# Patient Record
Sex: Male | Born: 1975 | Race: White | Hispanic: No | Marital: Married | Smoking: Never smoker
Health system: Southern US, Community
[De-identification: ages and names within clinical notes are randomized; demographics above are authoritative.]

## PROBLEM LIST (undated history)

## (undated) DIAGNOSIS — I10 Essential (primary) hypertension: Secondary | ICD-10-CM

## (undated) DIAGNOSIS — R011 Cardiac murmur, unspecified: Secondary | ICD-10-CM

## (undated) DIAGNOSIS — F419 Anxiety disorder, unspecified: Secondary | ICD-10-CM

## (undated) DIAGNOSIS — K219 Gastro-esophageal reflux disease without esophagitis: Secondary | ICD-10-CM

## (undated) HISTORY — DX: Gastro-esophageal reflux disease without esophagitis: K21.9

## (undated) HISTORY — DX: Essential (primary) hypertension: I10

## (undated) HISTORY — PX: TONSILLECTOMY: SUR1361

---

## 2004-09-09 ENCOUNTER — Emergency Department: Payer: Self-pay | Admitting: Unknown Physician Specialty

## 2007-04-03 ENCOUNTER — Emergency Department: Payer: Self-pay | Admitting: Internal Medicine

## 2017-11-20 ENCOUNTER — Encounter: Payer: Self-pay | Admitting: Urology

## 2017-11-20 ENCOUNTER — Ambulatory Visit: Payer: BLUE CROSS/BLUE SHIELD | Admitting: Urology

## 2017-11-20 VITALS — BP 149/89 | HR 71 | Ht 69.5 in | Wt 180.7 lb

## 2017-11-20 DIAGNOSIS — N4340 Spermatocele of epididymis, unspecified: Secondary | ICD-10-CM | POA: Diagnosis not present

## 2017-11-21 ENCOUNTER — Encounter: Payer: Self-pay | Admitting: Urology

## 2017-11-21 DIAGNOSIS — N4340 Spermatocele of epididymis, unspecified: Secondary | ICD-10-CM | POA: Insufficient documentation

## 2017-11-21 NOTE — Progress Notes (Signed)
11/20/2017 7:19 AM   Douglas Medina [birth year] [id number]  Referring provider: No referring provider defined for this encounter.  Chief Complaint  Patient presents with  . Other    Scrotal mass    HPI: 42 year old male presents for evaluation of a left hemiscrotal mass.  He states he saw Dr. Evelene Croon approximately 3-4 years ago for a pea-sized left hemiscrotal mass and states he was told it was "fluid buildup".  He did not have a scrotal ultrasound.  He has noted gradual increased size of this mass over the last 2 years.  He has no pain or discomfort.  He wanted to have it further evaluated to see if any treatment was needed.   PMH: Past Medical History:  Diagnosis Date  . GERD (gastroesophageal reflux disease)   . Hypertension     Surgical History: History reviewed. No pertinent surgical history.  Home Medications:  Allergies as of 11/20/2017   No Known Allergies     Medication List        Accurate as of 11/20/17 11:59 PM. Always use your most recent med list.          lisinopril 20 MG tablet Commonly known as:  PRINIVIL,ZESTRIL TK 1 T PO QD   pantoprazole 20 MG tablet Commonly known as:  PROTONIX TK 1 T PO QD       Allergies: No Known Allergies  Family History: History reviewed. No pertinent family history.  Social History:  reports that he has never smoked. He has never used smokeless tobacco. He reports that he drank alcohol. He reports that he has current or past drug history.  ROS: UROLOGY Frequent Urination?: No Hard to postpone urination?: No Burning/pain with urination?: No Get up at night to urinate?: No Leakage of urine?: No Urine stream starts and stops?: No Trouble starting stream?: No Do you have to strain to urinate?: No Blood in urine?: No Urinary tract infection?: No Sexually transmitted disease?: No Injury to kidneys or bladder?: No Painful intercourse?: No Weak stream?: No Erection problems?: No Penile pain?:  No  Gastrointestinal Nausea?: No Vomiting?: No Indigestion/heartburn?: No Diarrhea?: No Constipation?: No  Constitutional Fever: No Night sweats?: No Weight loss?: No Fatigue?: No  Skin Skin rash/lesions?: No Itching?: No  Eyes Blurred vision?: No Double vision?: No  Ears/Nose/Throat Sore throat?: No Sinus problems?: No  Hematologic/Lymphatic Swollen glands?: No Easy bruising?: No  Cardiovascular Leg swelling?: No Chest pain?: No  Respiratory Cough?: No Shortness of breath?: No  Endocrine Excessive thirst?: No  Musculoskeletal Back pain?: No Joint pain?: No  Neurological Headaches?: No Dizziness?: No  Psychologic Depression?: No Anxiety?: No  Physical Exam: BP (!) 149/89 (BP Location: Left Arm, Patient Position: Sitting, Cuff Size: Normal)   Pulse 71   Ht 5' 9.5" (1.765 m)   Wt 180 lb 11.2 oz (82 kg)   BMI 26.30 kg/m   Constitutional:  Alert and oriented, No acute distress. HEENT: [place] AT, moist mucus membranes.  Trachea midline, no masses. Cardiovascular: No clubbing, cyanosis, or edema. Respiratory: Normal respiratory effort, no increased work of breathing. GI: Abdomen is soft, nontender, nondistended, no abdominal masses GU: No CVA tenderness.  Penis without lesions.  Testes descended bilaterally without masses or tenderness. ~3 cm mobile, transilluminating left supra testicular mass consistent with spermatocele. Lymph: No cervical or inguinal lymphadenopathy. Skin: No rashes, bruises or suspicious lesions. Neurologic: Grossly intact, no focal deficits, moving all 4 extremities. Psychiatric: Normal mood and affect.   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  Based on lack of symptoms I recommended observation.  Spermatocelectomy was also discussed.  He will monitor and if it continues to increase in size he indicated he would be interested in pursuing spermatocelectomy.  He will call back as  needed.   Riki Altes, MD  [facility] [address], [address] [place], [place] [address] [phone]

## 2018-01-02 ENCOUNTER — Other Ambulatory Visit: Payer: Self-pay | Admitting: Optometry

## 2018-01-03 ENCOUNTER — Other Ambulatory Visit: Payer: Self-pay | Admitting: Optometry

## 2018-01-03 DIAGNOSIS — H4921 Sixth [abducent] nerve palsy, right eye: Secondary | ICD-10-CM

## 2018-02-12 ENCOUNTER — Ambulatory Visit
Admission: RE | Admit: 2018-02-12 | Discharge: 2018-02-12 | Disposition: A | Discharge status: Home / Self Care | Payer: BLUE CROSS/BLUE SHIELD | Source: Ambulatory Visit | Attending: Optometry | Admitting: Optometry

## 2018-02-12 DIAGNOSIS — H4921 Sixth [abducent] nerve palsy, right eye: Secondary | ICD-10-CM | POA: Diagnosis present

## 2018-02-12 LAB — POCT I-STAT CREATININE: Creatinine, Ser: 0.9 mg/dL (ref 0.61–1.24)

## 2018-02-12 MED ORDER — GADOBUTROL 1 MMOL/ML IV SOLN
7.0000 mL | Freq: Once | INTRAVENOUS | Status: AC | PRN
  Administered 2018-02-12: 7 mL via INTRAVENOUS

## 2018-08-27 ENCOUNTER — Other Ambulatory Visit: Payer: Self-pay | Admitting: Student

## 2018-08-27 DIAGNOSIS — M25462 Effusion, left knee: Secondary | ICD-10-CM

## 2018-08-27 DIAGNOSIS — M25562 Pain in left knee: Secondary | ICD-10-CM

## 2018-09-10 ENCOUNTER — Ambulatory Visit
Admission: RE | Admit: 2018-09-10 | Discharge: 2018-09-10 | Disposition: A | Discharge status: Home / Self Care | Payer: BC Managed Care – PPO | Source: Ambulatory Visit | Attending: Student | Admitting: Student

## 2018-09-10 ENCOUNTER — Other Ambulatory Visit: Payer: Self-pay

## 2018-09-10 DIAGNOSIS — M25462 Effusion, left knee: Secondary | ICD-10-CM | POA: Diagnosis present

## 2018-09-10 DIAGNOSIS — M25562 Pain in left knee: Secondary | ICD-10-CM | POA: Insufficient documentation

## 2019-10-22 NOTE — Progress Notes (Signed)
° °  10/23/2019 11:34 AM   Douglas Medina 01/10/1976 [id number]  Referring provider: Jaclyn Shaggy, MD [address]   [place],  [place] [address] No chief complaint on file.   HPI: Douglas Medina is a 44 y.o. male who is seen today for follow-up of a left spermatocele  -Patient was last evaluated in 10/2017 for left spermatocele. -Since last visit he reported gradual increased size of this mass over the last 2 years.  -Notes mild discomfort with tighter clothing and some radiation into the left groin region -Is interested in spermatocele ectomy   PMH: Past Medical History:  Diagnosis Date   GERD (gastroesophageal reflux disease)    Hypertension     Surgical History: History reviewed. No pertinent surgical history.  Home Medications:  Allergies as of 10/23/2019   No Known Allergies     Medication List       Accurate as of October 23, 2019 11:34 AM. If you have any questions, ask your nurse or doctor.        lisinopril 20 MG tablet Commonly known as: ZESTRIL TK 1 T PO QD   pantoprazole 20 MG tablet Commonly known as: PROTONIX TK 1 T PO QD       Allergies: No Known Allergies  Family History: History reviewed. No pertinent family history.  Social History:  reports that he has never smoked. He has never used smokeless tobacco. He reports previous alcohol use. He reports previous drug use.   Physical Exam: BP (!) 177/88    Pulse 84    Ht 5\' 9"  (1.753 m)    Wt 176 lb (79.8 kg)    BMI 25.99 kg/m   Constitutional:  Alert and oriented, No acute distress. HEENT: [place] AT, moist mucus membranes.  Trachea midline, no masses. Cardiovascular: No clubbing, cyanosis, or edema. Respiratory: Normal respiratory effort, no increased work of breathing. GI: Abdomen is soft, nontender, nondistended, no abdominal masses GU: No CVA tenderness. 5 cm left spermatocele.  Testes descended bilaterally without masses or tenderness Lymph: No cervical or inguinal  lymphadenopathy. Skin: No rashes, bruises or suspicious lesions. Neurologic: Grossly intact, no focal deficits, moving all 4 extremities. Psychiatric: Normal mood and affect.   Assessment & Plan:    1. Left spermatocele   He would like to schedule spermatocelectomy  The procedure was discussed in detail including potential risks of bleeding, infection, scarring, recurrence, chronic scrotal pain as well as anesthetic risks.  The alternative of observation was discussed.  The postoperative care and follow-up was discussed   [facility] [address], [address] [place], [place] [phone]  I, [phone], am acting as a scribe for Dr. Theador Hawthorne C. Amelia Macken,  I have reviewed the above documentation for accuracy and completeness, and I agree with the above.   Lorin Picket, MD

## 2019-10-23 ENCOUNTER — Ambulatory Visit (INDEPENDENT_AMBULATORY_CARE_PROVIDER_SITE_OTHER): Payer: BC Managed Care – PPO | Admitting: Urology

## 2019-10-23 ENCOUNTER — Encounter: Payer: Self-pay | Admitting: Urology

## 2019-10-23 ENCOUNTER — Other Ambulatory Visit: Payer: Self-pay

## 2019-10-23 VITALS — BP 177/88 | HR 84 | Ht 69.0 in | Wt 176.0 lb

## 2019-10-23 DIAGNOSIS — N4340 Spermatocele of epididymis, unspecified: Secondary | ICD-10-CM | POA: Diagnosis not present

## 2019-10-26 ENCOUNTER — Other Ambulatory Visit: Payer: Self-pay | Admitting: Radiology

## 2019-10-26 DIAGNOSIS — N4340 Spermatocele of epididymis, unspecified: Secondary | ICD-10-CM

## 2019-12-08 ENCOUNTER — Ambulatory Visit: Payer: BC Managed Care – PPO | Admitting: Urology

## 2020-01-15 ENCOUNTER — Other Ambulatory Visit: Payer: BC Managed Care – PPO

## 2020-01-22 ENCOUNTER — Other Ambulatory Visit: Payer: BC Managed Care – PPO

## 2020-01-25 ENCOUNTER — Other Ambulatory Visit: Payer: BC Managed Care – PPO

## 2020-01-26 ENCOUNTER — Ambulatory Visit: Admit: 2020-01-26 | Payer: BC Managed Care – PPO | Admitting: Urology

## 2020-01-26 SURGERY — EXCISION, SPERMATOCELE
Anesthesia: Choice | Laterality: Left

## 2020-02-11 IMAGING — MR MRI OF THE LEFT KNEE WITHOUT CONTRAST
6 series · 40 of 40 positions shown · non-contrast
Comparison: None.

CLINICAL DATA: Medial left knee pain

EXAM:
MRI OF THE LEFT KNEE WITHOUT CONTRAST
TECHNIQUE: Multiplanar, multisequence MR imaging of the knee was performed. No
intravenous contrast was administered.

[Series 8: T2 fat-sat · axial · left · 4.0mm · 0.50mm/px · z∈[-88,+37]mm · 5 of 26 slices shown (1 of 3)]
[im 1/26]
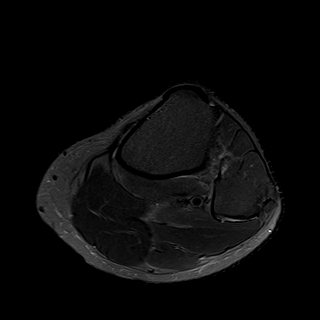
[im 7/26]
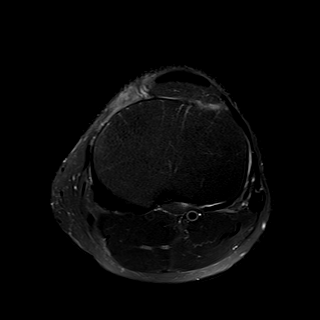
[im 13/26]
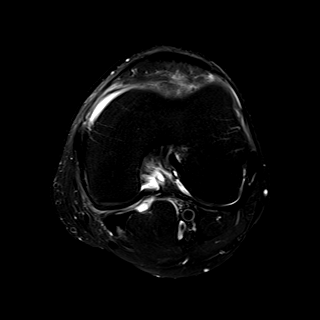
[im 19/26]
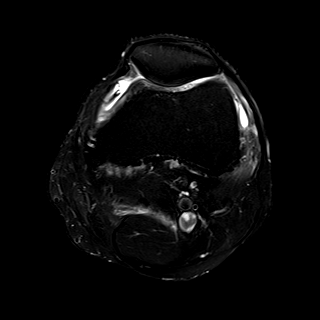
[im 26/26]
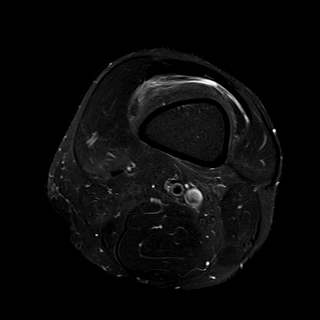

[Series 9: T1 · coronal · left · 4.0mm · 0.42mm/px · 7 of 28 slices shown]
[im 1/28]
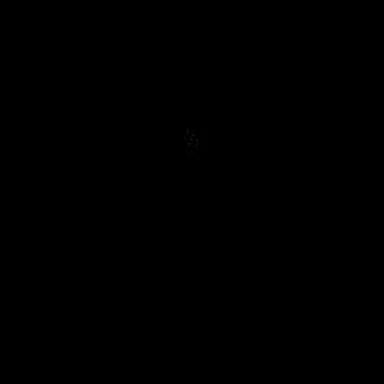
[im 5/28]
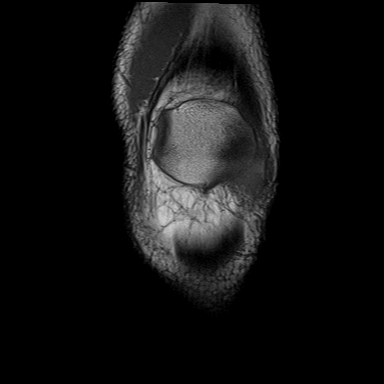
[im 10/28]
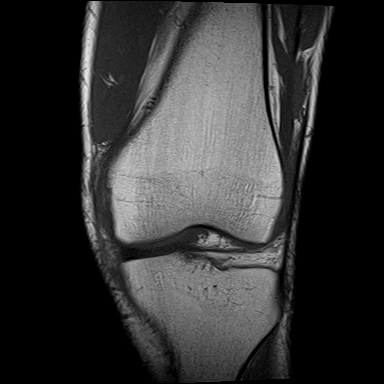
[im 14/28]
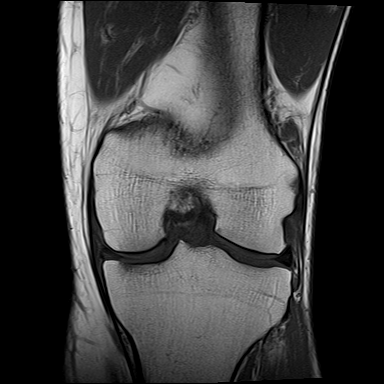
[im 19/28]
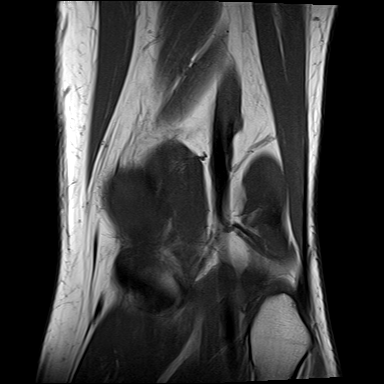
[im 23/28]
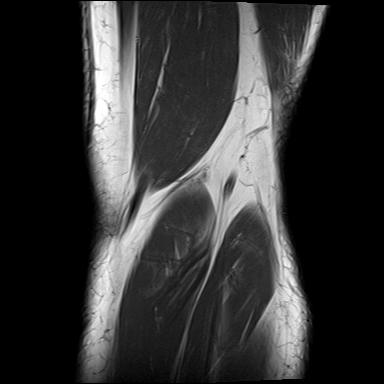
[im 28/28]
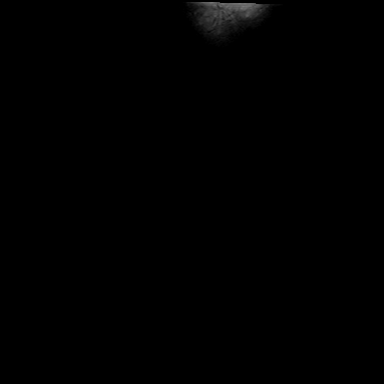

[Series 10: T2 fat-sat · coronal · left · 4.0mm · 0.59mm/px · 7 of 28 slices shown (2 of 3)]
[im 1/28]
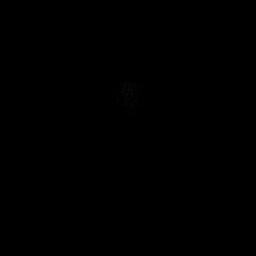
[im 5/28]
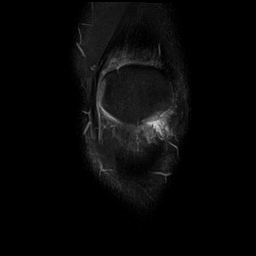
[im 10/28]
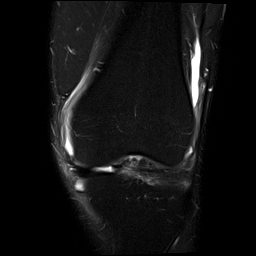
[im 14/28]
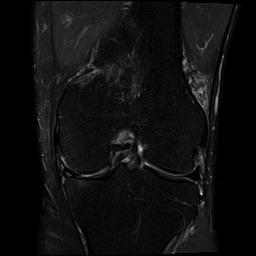
[im 19/28]
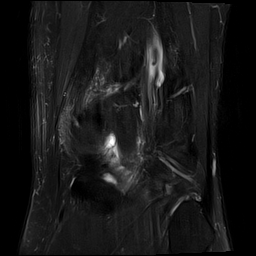
[im 23/28]
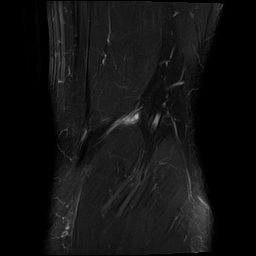
[im 28/28]
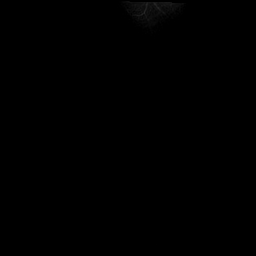

[Series 11: PD fat-sat · coronal · left · 4.0mm · 0.59mm/px · 7 of 28 slices shown (1 of 2)]
[im 1/28]
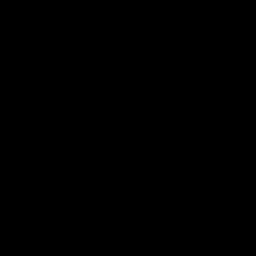
[im 5/28]
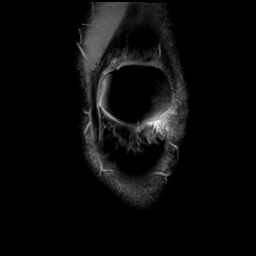
[im 10/28]
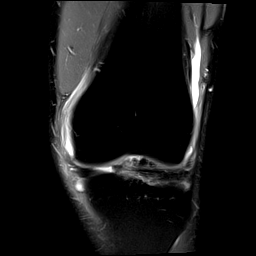
[im 14/28]
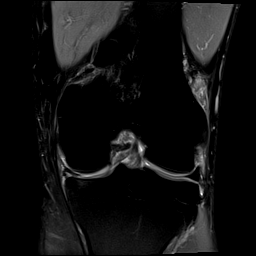
[im 19/28]
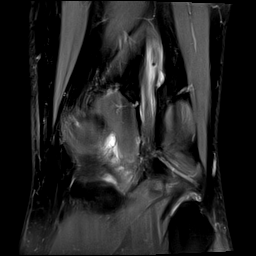
[im 23/28]
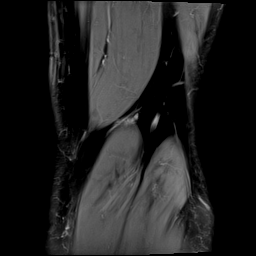
[im 28/28]
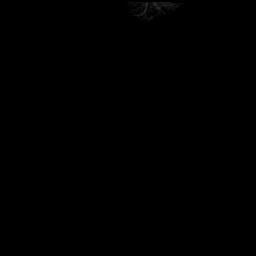

[Series 12: PD fat-sat · sagittal · left · 3.0mm · 0.59mm/px · 7 of 29 slices shown (2 of 2)]
[im 1/29]
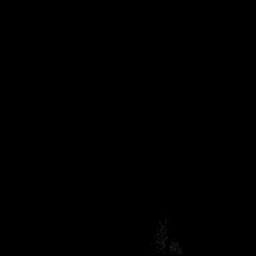
[im 5/29]
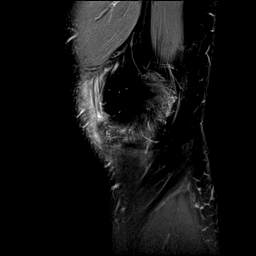
[im 10/29]
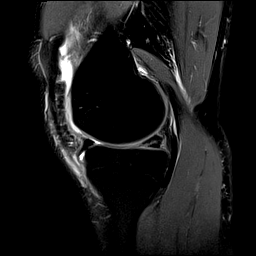
[im 15/29]
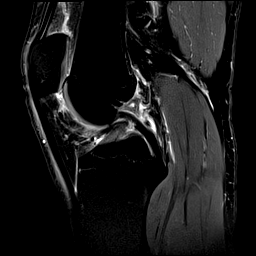
[im 19/29]
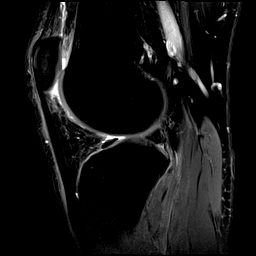
[im 24/29]
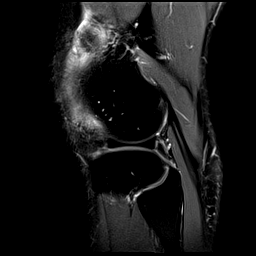
[im 29/29]
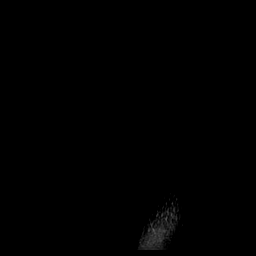

[Series 13: T2 fat-sat · sagittal · left · 3.0mm · 0.59mm/px · 7 of 31 slices shown (3 of 3)]
[im 1/31]
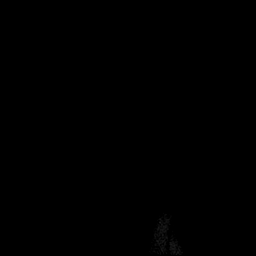
[im 6/31]
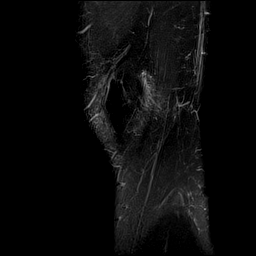
[im 11/31]
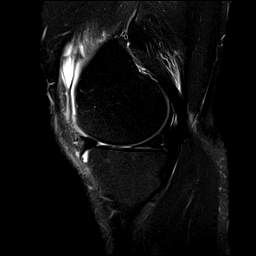
[im 16/31]
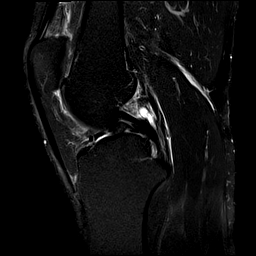
[im 21/31]
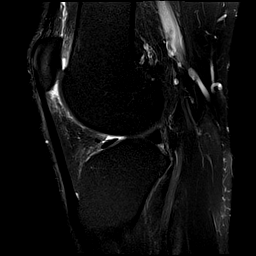
[im 26/31]
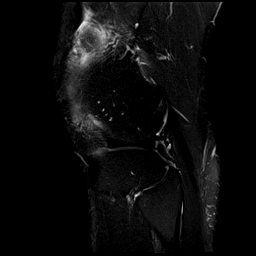
[im 31/31]
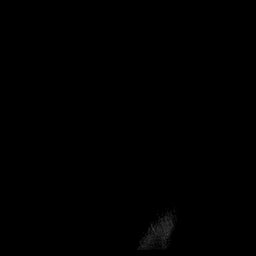

[40 of 40 positions shown; findings below may reference images not displayed]

FINDINGS: MENISCI

Medial meniscus: Extensive complex medial meniscal tear of the
medial meniscal body and posterior horn with prominent horizontal
component and a fragment of meniscal tissue displaced into the
intercondylar notch (series 12, image 15).

Lateral meniscus:  Intact.

LIGAMENTS

Cruciates:  Intact ACL and PCL.

Collaterals: Medial collateral ligament is intact. Lateral
collateral ligament complex is intact.

CARTILAGE

Patellofemoral: Mild surface irregularity of the superior patellar
apex. No focal trochlear cartilage defect identified.

Medial: Mild cartilage thinning of the weight-bearing aspect of the
medial compartment with faint subchondral marrow changes in the
medial tibial plateau.

Lateral:  No focal chondral defect.

Joint: Trace joint effusion. Mild edema within Hoffa's fat and the
suprapatellar fat.

Popliteal Fossa:  No Baker's cyst.  Intact popliteus tendon.

Extensor Mechanism:  Intact quadriceps tendon and patellar tendon.

Bones: Faint subchondral marrow edema within the medial compartment.
No fracture. No bone lesion.

Other: None.
IMPRESSION: 1. Complex tear of the medial meniscal body and posterior horn with
flap component displaced in the intercondylar notch.
2. Mild medial and patellofemoral compartment cartilage
irregularity.

## 2020-02-24 ENCOUNTER — Ambulatory Visit: Payer: BC Managed Care – PPO | Admitting: Urology

## 2020-03-09 ENCOUNTER — Ambulatory Visit: Payer: BC Managed Care – PPO | Admitting: Urology

## 2020-03-23 ENCOUNTER — Ambulatory Visit (INDEPENDENT_AMBULATORY_CARE_PROVIDER_SITE_OTHER): Payer: BC Managed Care – PPO | Admitting: Urology

## 2020-03-23 ENCOUNTER — Encounter: Payer: Self-pay | Admitting: Urology

## 2020-03-23 ENCOUNTER — Other Ambulatory Visit: Payer: Self-pay

## 2020-03-23 VITALS — BP 172/76 | HR 92 | Ht 70.0 in | Wt 180.0 lb

## 2020-03-23 DIAGNOSIS — N5201 Erectile dysfunction due to arterial insufficiency: Secondary | ICD-10-CM | POA: Diagnosis not present

## 2020-03-23 DIAGNOSIS — N4340 Spermatocele of epididymis, unspecified: Secondary | ICD-10-CM

## 2020-03-23 MED ORDER — TADALAFIL 20 MG PO TABS: 20.0000 mg | ORAL_TABLET | Freq: Every day | ORAL | 0 refills | Status: DC | PRN

## 2020-03-23 NOTE — Progress Notes (Signed)
° °  03/23/2020 12:30 PM   Douglas Medina Feb 24, 1975 [id number]  Referring provider: Jaclyn Shaggy, MD [address]   [place],  [place] [address]  Chief Complaint  Patient presents with   Other    HPI: 45 y.o. male presents for follow-up of a left spermatocele.   Was seen at 10/23/2019 and elected to schedule spermatocelectomy  He was scheduled in late December however elected to postpone secondary to COVID surge  No significant change in size or increased pain  He states his PCP recently doubled his dose of lisinopril for hypertension and now complains of difficulty achieving and maintaining an erection   PMH: Past Medical History:  Diagnosis Date   GERD (gastroesophageal reflux disease)    Hypertension     Surgical History: History reviewed. No pertinent surgical history.  Home Medications:  Allergies as of 03/23/2020   No Known Allergies     Medication List       Accurate as of March 23, 2020 11:59 PM. If you have any questions, ask your nurse or doctor.        lisinopril 20 MG tablet Commonly known as: ZESTRIL TK 1 T PO QD   pantoprazole 20 MG tablet Commonly known as: PROTONIX TK 1 T PO QD   tadalafil 20 MG tablet Commonly known as: CIALIS Take 1 tablet (20 mg total) by mouth daily as needed for erectile dysfunction. Take one tab one hour prior to intercourse Started by: Riki Altes, MD       Allergies: No Known Allergies  Family History: History reviewed. No pertinent family history.  Social History:  reports that he has never smoked. He has never used smokeless tobacco. He reports previous alcohol use. He reports previous drug use.   Physical Exam: BP (!) 172/76    Pulse 92    Ht 5\' 10"  (1.778 m)    Wt 180 lb (81.6 kg)    BMI 25.83 kg/m   Constitutional:  Alert and oriented, No acute distress. HEENT: [place] AT, moist mucus membranes.  Trachea midline, no masses. Cardiovascular: No clubbing, cyanosis, or edema. Respiratory:  Normal respiratory effort, no increased work of breathing. CV: RRR GI: Abdomen is soft, nontender, nondistended, no abdominal masses GU: Testes descended bilaterally without masses or tenderness.  5 cm left spermatocele Skin: No rashes, bruises or suspicious lesions. Neurologic: Grossly intact, no focal deficits, moving all 4 extremities. Psychiatric: Normal mood and affect.   Assessment & Plan:    1.  Left spermatocele  I again discussed the procedure in detail including potential risks of bleeding, infection, scarring, recurrence and chronic scrotal pain.  Alternatives were again reviewed including observation.  All questions were answered and he desires to proceed  2.  Erectile dysfunction  New problem  Onset noted after increasing dose of antihypertensive medication  He was interested in a PDE 5 inhibitor in Rx tadalafil 20 mg sent to pharmacy.  Potential side effects were discussed.   , MD  [facility] [address], [address] [place], [place] [phone]

## 2020-03-23 NOTE — H&P (View-Only) (Signed)
   03/23/2020 12:30 PM   [name] 1976-01-21 [id number]  Referring provider: Jaclyn Shaggy, MD [address]   [place],  [place] [address]  Chief Complaint  Patient presents with  . Other    HPI: 45 y.o. male presents for follow-up of a left spermatocele.   Was seen at 10/23/2019 and elected to schedule spermatocelectomy  He was scheduled in late December however elected to postpone secondary to COVID surge  No significant change in size or increased pain  He states his PCP recently doubled his dose of lisinopril for hypertension and now complains of difficulty achieving and maintaining an erection   PMH: Past Medical History:  Diagnosis Date  . GERD (gastroesophageal reflux disease)   . Hypertension     Surgical History: History reviewed. No pertinent surgical history.  Home Medications:  Allergies as of 03/23/2020   No Known Allergies     Medication List       Accurate as of March 23, 2020 11:59 PM. If you have any questions, ask your nurse or doctor.        lisinopril 20 MG tablet Commonly known as: ZESTRIL TK 1 T PO QD   pantoprazole 20 MG tablet Commonly known as: PROTONIX TK 1 T PO QD   tadalafil 20 MG tablet Commonly known as: CIALIS Take 1 tablet (20 mg total) by mouth daily as needed for erectile dysfunction. Take one tab one hour prior to intercourse Started by: Riki Altes, MD       Allergies: No Known Allergies  Family History: History reviewed. No pertinent family history.  Social History:  reports that he has never smoked. He has never used smokeless tobacco. He reports previous alcohol use. He reports previous drug use.   Physical Exam: BP (!) 172/76   Pulse 92   Ht 5\' 10"  (1.778 m)   Wt 180 lb (81.6 kg)   BMI 25.83 kg/m   Constitutional:  Alert and oriented, No acute distress. HEENT: [place] AT, moist mucus membranes.  Trachea midline, no masses. Cardiovascular: No clubbing, cyanosis, or edema. Respiratory:  Normal respiratory effort, no increased work of breathing. CV: RRR GI: Abdomen is soft, nontender, nondistended, no abdominal masses GU: Testes descended bilaterally without masses or tenderness.  5 cm left spermatocele Skin: No rashes, bruises or suspicious lesions. Neurologic: Grossly intact, no focal deficits, moving all 4 extremities. Psychiatric: Normal mood and affect.   Assessment & Plan:    1.  Left spermatocele  I again discussed the procedure in detail including potential risks of bleeding, infection, scarring, recurrence and chronic scrotal pain.  Alternatives were again reviewed including observation.  All questions were answered and he desires to proceed  2.  Erectile dysfunction  New problem  Onset noted after increasing dose of antihypertensive medication  He was interested in a PDE 5 inhibitor in Rx tadalafil 20 mg sent to pharmacy.  Potential side effects were discussed.   , MD  [facility] [address], [address] [place], [place] [phone]

## 2020-03-24 ENCOUNTER — Encounter: Payer: Self-pay | Admitting: Urology

## 2020-03-28 ENCOUNTER — Other Ambulatory Visit: Payer: Self-pay | Admitting: Radiology

## 2020-03-28 DIAGNOSIS — N4340 Spermatocele of epididymis, unspecified: Secondary | ICD-10-CM

## 2020-04-12 ENCOUNTER — Other Ambulatory Visit: Payer: Self-pay

## 2020-04-12 ENCOUNTER — Other Ambulatory Visit
Admission: RE | Admit: 2020-04-12 | Discharge: 2020-04-12 | Disposition: A | Discharge status: Home / Self Care | Payer: BC Managed Care – PPO | Source: Ambulatory Visit | Attending: Urology | Admitting: Urology

## 2020-04-12 HISTORY — DX: Cardiac murmur, unspecified: R01.1

## 2020-04-12 HISTORY — DX: Anxiety disorder, unspecified: F41.9

## 2020-04-12 NOTE — Patient Instructions (Addendum)
Your procedure is scheduled on: 04/19/20 - Tuesday Report to the Registration Desk on the 1st floor of the [facility]. To find out your arrival time, please call [phone] between 1PM - 3PM on: 04/18/20 - Monday Report to [facility] for Labs/ EKG and Covid testing 04/15/20 at 1pm  REMEMBER: Instructions that are not followed completely may result in serious medical risk, up to and including death; or upon the discretion of your surgeon and anesthesiologist your surgery may need to be rescheduled.  Do not eat food or drink any fluids after midnight the night before surgery.  No gum chewing, lozengers or hard candies.  TAKE THESE MEDICATIONS THE MORNING OF SURGERY WITH A SIP OF WATER:  - pantoprazole (PROTONIX) 20 MG tablet, take one the night before and one on the morning of surgery - helps to prevent nausea after surgery.  Follow recommendations from Cardiologist, Pulmonologist or PCP regarding stopping Aspirin, Coumadin, Plavix, Eliquis, Pradaxa, or Pletal.  One week prior to surgery: Stop Anti-inflammatories (NSAIDS) such as Advil, Aleve, Ibuprofen, Motrin, Naproxen, Naprosyn and Aspirin based products such as Excedrin, Goodys Powder, BC Powder.  Stop ANY OVER THE COUNTER supplements until after surgery.   No Alcohol for 24 hours before or after surgery.  No Smoking including e-cigarettes for 24 hours prior to surgery.  No chewable tobacco products for at least 6 hours prior to surgery.  No nicotine patches on the day of surgery.  Do not use any "recreational" drugs for at least a week prior to your surgery.  Please be advised that the combination of cocaine and anesthesia may have negative outcomes, up to and including death. If you test positive for cocaine, your surgery will be cancelled.  On the morning of surgery brush your teeth with toothpaste and water, you may rinse your mouth with mouthwash if you wish. Do not swallow any toothpaste or mouthwash.  Do not wear  jewelry, make-up, hairpins, clips or nail polish.  Do not wear lotions, powders, or perfumes.   Do not shave body from the neck down 48 hours prior to surgery just in case you cut yourself which could leave a site for infection.  Also, freshly shaved skin may become irritated if using the CHG soap.  Contact lenses, hearing aids and dentures may not be worn into surgery.  Do not bring valuables to the hospital. [facility] is not responsible for any missing/lost belongings or valuables.   Notify your doctor if there is any change in your medical condition (cold, fever, infection).  Wear comfortable clothing (specific to your surgery type) to the hospital.  Plan for stool softeners for home use; pain medications have a tendency to cause constipation. You can also help prevent constipation by eating foods high in fiber such as fruits and vegetables and drinking plenty of fluids as your diet allows.  After surgery, you can help prevent lung complications by doing breathing exercises.  Take deep breaths and cough every 1-2 hours. Your doctor may order a device called an Incentive Spirometer to help you take deep breaths. When coughing or sneezing, hold a pillow firmly against your incision with both hands. This is called "splinting." Doing this helps protect your incision. It also decreases belly discomfort.  If you are being admitted to the hospital overnight, leave your suitcase in the car. After surgery it may be brought to your room.  If you are being discharged the day of surgery, you will not be allowed to drive home. You  will need a responsible adult (18 years or older) to drive you home and stay with you that night.   If you are taking public transportation, you will need to have a responsible adult (18 years or older) with you. Please confirm with your physician that it is acceptable to use public transportation.   Please call the Pre-admissions Testing Dept. at [phone] if  you have any questions about these instructions.  Surgery Visitation Policy:  Patients undergoing a surgery or procedure may have one family member or support person with them as long as that person is not COVID-19 positive or experiencing its symptoms.  That person may remain in the waiting area during the procedure.  Inpatient Visitation:    Visiting hours are 7 a.m. to 8 p.m. Inpatients will be allowed two visitors daily. The visitors may change each day during the patient's stay. No visitors under the age of 62. Any visitor under the age of 80 must be accompanied by an adult. The visitor must pass COVID-19 screenings, use hand sanitizer when entering and exiting the patient's room and wear a mask at all times, including in the patient's room. Patients must also wear a mask when staff or their visitor are in the room. Masking is required regardless of vaccination status.

## 2020-04-15 ENCOUNTER — Other Ambulatory Visit: Payer: Self-pay

## 2020-04-15 ENCOUNTER — Other Ambulatory Visit
Admission: RE | Admit: 2020-04-15 | Discharge: 2020-04-15 | Disposition: A | Discharge status: Home / Self Care | Payer: BC Managed Care – PPO | Source: Ambulatory Visit | Attending: Urology | Admitting: Urology

## 2020-04-15 DIAGNOSIS — Z01818 Encounter for other preprocedural examination: Secondary | ICD-10-CM | POA: Diagnosis not present

## 2020-04-15 DIAGNOSIS — Z20822 Contact with and (suspected) exposure to covid-19: Secondary | ICD-10-CM | POA: Insufficient documentation

## 2020-04-15 DIAGNOSIS — I10 Essential (primary) hypertension: Secondary | ICD-10-CM | POA: Insufficient documentation

## 2020-04-15 LAB — BASIC METABOLIC PANEL
Anion gap: 7 (ref 5–15)
BUN: 17 mg/dL (ref 6–20)
CO2: 26 mmol/L (ref 22–32)
Calcium: 9.3 mg/dL (ref 8.9–10.3)
Chloride: 107 mmol/L (ref 98–111)
Creatinine, Ser: 0.82 mg/dL (ref 0.61–1.24)
GFR, Estimated: >60 mL/min (ref >60)
Glucose, Bld: 84 mg/dL (ref 70–99)
Potassium: 3.8 mmol/L (ref 3.5–5.1)
Sodium: 140 mmol/L (ref 135–145)

## 2020-04-15 LAB — CBC
HCT: 44.5 % (ref 39.0–52.0)
Hemoglobin: 15.9 g/dL (ref 13.0–17.0)
MCH: 30.8 pg (ref 26.0–34.0)
MCHC: 35.7 g/dL (ref 30.0–36.0)
MCV: 86.1 fL (ref 80.0–100.0)
Platelets: 261 10*3/uL (ref 150–400)
RBC: 5.17 MIL/uL (ref 4.22–5.81)
RDW: 11.9 % (ref 11.5–15.5)
WBC: 6.4 10*3/uL (ref 4.0–10.5)
nRBC: 0 % (ref 0.0–0.2)

## 2020-04-15 LAB — SARS CORONAVIRUS 2 (TAT 6-24 HRS): SARS Coronavirus 2: NEGATIVE

## 2020-04-19 ENCOUNTER — Other Ambulatory Visit: Payer: Self-pay

## 2020-04-19 ENCOUNTER — Ambulatory Visit: Payer: BC Managed Care – PPO | Admitting: Anesthesiology

## 2020-04-19 ENCOUNTER — Encounter: Payer: Self-pay | Admitting: Urology

## 2020-04-19 ENCOUNTER — Ambulatory Visit
Admission: RE | Admit: 2020-04-19 | Discharge: 2020-04-19 | Disposition: A | Discharge status: Home / Self Care | Payer: BC Managed Care – PPO | Attending: Urology | Admitting: Urology

## 2020-04-19 ENCOUNTER — Encounter
Admission: RE | Disposition: A | Discharge status: Home / Self Care | Payer: Self-pay | Source: Home / Self Care | Attending: Urology

## 2020-04-19 DIAGNOSIS — Z79899 Other long term (current) drug therapy: Secondary | ICD-10-CM | POA: Diagnosis not present

## 2020-04-19 DIAGNOSIS — N4341 Spermatocele of epididymis, single: Secondary | ICD-10-CM | POA: Insufficient documentation

## 2020-04-19 DIAGNOSIS — N4340 Spermatocele of epididymis, unspecified: Secondary | ICD-10-CM | POA: Diagnosis not present

## 2020-04-19 HISTORY — PX: SPERMATOCELECTOMY: SHX2420

## 2020-04-19 SURGERY — EXCISION, SPERMATOCELE
Anesthesia: General | Laterality: Left

## 2020-04-19 MED ORDER — FENTANYL CITRATE (PF) 100 MCG/2ML IJ SOLN
INTRAMUSCULAR | Status: DC | PRN
  Administered 2020-04-19 (×2): 50 ug via INTRAVENOUS

## 2020-04-19 MED ORDER — EPHEDRINE 5 MG/ML INJ
INTRAVENOUS | Status: AC
  Filled 2020-04-19: qty 10

## 2020-04-19 MED ORDER — PROPOFOL 10 MG/ML IV BOLUS
INTRAVENOUS | Status: DC | PRN
  Administered 2020-04-19: 200 mg via INTRAVENOUS

## 2020-04-19 MED ORDER — DOUBLE ANTIBIOTIC 500-10000 UNIT/GM EX OINT
TOPICAL_OINTMENT | CUTANEOUS | Status: DC | PRN
  Administered 2020-04-19: 1 via TOPICAL

## 2020-04-19 MED ORDER — CHLORHEXIDINE GLUCONATE 0.12 % MT SOLN
15.0000 mL | Freq: Once | OROMUCOSAL | Status: AC
  Administered 2020-04-19: 15 mL via OROMUCOSAL

## 2020-04-19 MED ORDER — PHENYLEPHRINE HCL (PRESSORS) 10 MG/ML IV SOLN
INTRAVENOUS | Status: AC
  Filled 2020-04-19: qty 1

## 2020-04-19 MED ORDER — CEFAZOLIN SODIUM-DEXTROSE 2-4 GM/100ML-% IV SOLN
2.0000 g | INTRAVENOUS | Status: AC
  Administered 2020-04-19: 2 g via INTRAVENOUS

## 2020-04-19 MED ORDER — ONDANSETRON HCL 4 MG/2ML IJ SOLN
INTRAMUSCULAR | Status: AC
  Filled 2020-04-19: qty 2

## 2020-04-19 MED ORDER — DEXMEDETOMIDINE (PRECEDEX) IN NS 20 MCG/5ML (4 MCG/ML) IV SYRINGE
PREFILLED_SYRINGE | INTRAVENOUS | Status: DC | PRN
  Administered 2020-04-19 (×2): 8 ug via INTRAVENOUS
  Administered 2020-04-19: 4 ug via INTRAVENOUS

## 2020-04-19 MED ORDER — ACETAMINOPHEN 10 MG/ML IV SOLN
INTRAVENOUS | Status: AC
  Filled 2020-04-19: qty 100

## 2020-04-19 MED ORDER — PROPOFOL 10 MG/ML IV BOLUS
INTRAVENOUS | Status: AC
  Filled 2020-04-19: qty 20

## 2020-04-19 MED ORDER — ORAL CARE MOUTH RINSE: 15.0000 mL | Freq: Once | OROMUCOSAL | Status: AC

## 2020-04-19 MED ORDER — LIDOCAINE HCL (CARDIAC) PF 100 MG/5ML IV SOSY
PREFILLED_SYRINGE | INTRAVENOUS | Status: DC | PRN
  Administered 2020-04-19: 80 mg via INTRAVENOUS

## 2020-04-19 MED ORDER — ONDANSETRON HCL 4 MG/2ML IJ SOLN: 4.0000 mg | Freq: Once | INTRAMUSCULAR | Status: DC | PRN

## 2020-04-19 MED ORDER — ONDANSETRON HCL 4 MG/2ML IJ SOLN
INTRAMUSCULAR | Status: DC | PRN
  Administered 2020-04-19: 4 mg via INTRAVENOUS

## 2020-04-19 MED ORDER — DEXAMETHASONE SODIUM PHOSPHATE 10 MG/ML IJ SOLN
INTRAMUSCULAR | Status: AC
  Filled 2020-04-19: qty 1

## 2020-04-19 MED ORDER — LIDOCAINE HCL (PF) 2 % IJ SOLN
INTRAMUSCULAR | Status: AC
  Filled 2020-04-19: qty 5

## 2020-04-19 MED ORDER — FENTANYL CITRATE (PF) 100 MCG/2ML IJ SOLN
25.0000 ug | INTRAMUSCULAR | Status: DC | PRN
  Administered 2020-04-19 (×4): 25 ug via INTRAVENOUS

## 2020-04-19 MED ORDER — MIDAZOLAM HCL 2 MG/2ML IJ SOLN
INTRAMUSCULAR | Status: AC
  Filled 2020-04-19: qty 2

## 2020-04-19 MED ORDER — HYDROCODONE-ACETAMINOPHEN 5-325 MG PO TABS: 1.0000 | ORAL_TABLET | Freq: Four times a day (QID) | ORAL | 0 refills | Status: DC | PRN

## 2020-04-19 MED ORDER — CHLORHEXIDINE GLUCONATE 0.12 % MT SOLN
OROMUCOSAL | Status: AC
  Filled 2020-04-19: qty 15

## 2020-04-19 MED ORDER — FENTANYL CITRATE (PF) 100 MCG/2ML IJ SOLN
INTRAMUSCULAR | Status: AC
  Filled 2020-04-19: qty 2

## 2020-04-19 MED ORDER — BACITRACIN ZINC 500 UNIT/GM EX OINT
TOPICAL_OINTMENT | CUTANEOUS | Status: AC
  Filled 2020-04-19: qty 28.35

## 2020-04-19 MED ORDER — EPHEDRINE SULFATE 50 MG/ML IJ SOLN
INTRAMUSCULAR | Status: DC | PRN
  Administered 2020-04-19: 5 mg via INTRAVENOUS
  Administered 2020-04-19: 10 mg via INTRAVENOUS
  Administered 2020-04-19: 5 mg via INTRAVENOUS

## 2020-04-19 MED ORDER — BUPIVACAINE HCL 0.25 % IJ SOLN
INTRAMUSCULAR | Status: DC | PRN
  Administered 2020-04-19: 4 mL

## 2020-04-19 MED ORDER — LACTATED RINGERS IV SOLN: INTRAVENOUS | Status: DC

## 2020-04-19 MED ORDER — DEXAMETHASONE SODIUM PHOSPHATE 10 MG/ML IJ SOLN
INTRAMUSCULAR | Status: DC | PRN
  Administered 2020-04-19: 10 mg via INTRAVENOUS

## 2020-04-19 MED ORDER — CEFAZOLIN SODIUM-DEXTROSE 2-4 GM/100ML-% IV SOLN
INTRAVENOUS | Status: AC
  Filled 2020-04-19: qty 100

## 2020-04-19 MED ORDER — ACETAMINOPHEN 10 MG/ML IV SOLN
INTRAVENOUS | Status: DC | PRN
  Administered 2020-04-19: 1000 mg via INTRAVENOUS

## 2020-04-19 MED ORDER — BUPIVACAINE HCL (PF) 0.25 % IJ SOLN
INTRAMUSCULAR | Status: AC
  Filled 2020-04-19: qty 30

## 2020-04-19 MED ORDER — DEXMEDETOMIDINE (PRECEDEX) IN NS 20 MCG/5ML (4 MCG/ML) IV SYRINGE
PREFILLED_SYRINGE | INTRAVENOUS | Status: AC
  Filled 2020-04-19: qty 5

## 2020-04-19 SURGICAL SUPPLY — 32 items
APL PRP STRL LF DISP 70% ISPRP (MISCELLANEOUS) ×1
BLADE SURG 15 STRL LF DISP TIS (BLADE) ×1 IMPLANT
BLADE SURG 15 STRL SS (BLADE) ×2
CANISTER SUCT 1200ML W/VALVE (MISCELLANEOUS) IMPLANT
CHLORAPREP W/TINT 26 (MISCELLANEOUS) ×2 IMPLANT
COVER WAND RF STERILE (DRAPES) ×2 IMPLANT
DRAIN PENROSE 0.625X18 (DRAIN) ×2 IMPLANT
DRAPE LAPAROTOMY 77X122 PED (DRAPES) ×2 IMPLANT
ELECT CAUTERY NEEDLE TIP 1.0 (MISCELLANEOUS) ×2
ELECT REM PT RETURN 9FT ADLT (ELECTROSURGICAL) ×2
ELECTRODE CAUTERY NEDL TIP 1.0 (MISCELLANEOUS) ×1 IMPLANT
ELECTRODE REM PT RTRN 9FT ADLT (ELECTROSURGICAL) ×1 IMPLANT
GAUZE SPONGE 4X4 12PLY STRL (GAUZE/BANDAGES/DRESSINGS) ×2 IMPLANT
GLOVE SURG UNDER POLY LF SZ7.5 (GLOVE) ×2 IMPLANT
GOWN STRL REUS W/ TWL LRG LVL3 (GOWN DISPOSABLE) ×2 IMPLANT
GOWN STRL REUS W/TWL LRG LVL3 (GOWN DISPOSABLE) ×4
GOWN STRL REUS W/TWL XL LVL3 (GOWN DISPOSABLE) ×2 IMPLANT
KIT TURNOVER KIT A (KITS) ×2 IMPLANT
LABEL OR SOLS (LABEL) ×2 IMPLANT
MANIFOLD NEPTUNE II (INSTRUMENTS) ×2 IMPLANT
NEEDLE HYPO 25X1 1.5 SAFETY (NEEDLE) ×2 IMPLANT
NS IRRIG 1000ML POUR BTL (IV SOLUTION) ×2 IMPLANT
PACK BASIN MINOR ARMC (MISCELLANEOUS) ×2 IMPLANT
SUPPORETR ATHLETIC LG (MISCELLANEOUS) ×1 IMPLANT
SUPPORTER ATHLETIC LG (MISCELLANEOUS) ×2
SUT CHROMIC 3 0 SH 27 (SUTURE) ×2 IMPLANT
SUT MNCRL 3-0 UNDYED SH (SUTURE) ×1 IMPLANT
SUT MONOCRYL 3-0 UNDYED (SUTURE) ×1
SUT SILK 0 CT 1 30 (SUTURE) ×2 IMPLANT
SUT VIC AB 3-0 SH 27 (SUTURE) ×4
SUT VIC AB 3-0 SH 27X BRD (SUTURE) ×2 IMPLANT
SYR 10ML LL (SYRINGE) ×2 IMPLANT

## 2020-04-19 NOTE — Interval H&P Note (Signed)
History and Physical Interval Note:  04/19/2020 7:19 AM  Douglas Medina  has presented today for surgery, with the diagnosis of left spermatocele.  The various methods of treatment have been discussed with the patient and family. After consideration of risks, benefits and other options for treatment, the patient has consented to  Procedure(s): SPERMATOCELECTOMY (Left) as a surgical intervention.  The patient's history has been reviewed, patient examined, no change in status, stable for surgery.  I have reviewed the patient's chart and labs.  Questions were answered to the patient's satisfaction.     Alida Greiner C Celedonio Sortino

## 2020-04-19 NOTE — Transfer of Care (Signed)
Immediate Anesthesia Transfer of Care Note  Patient: Douglas Medina  Procedure(s) Performed: SPERMATOCELECTOMY (Left )  Patient Location: PACU  Anesthesia Type:General  Level of Consciousness: drowsy  Airway & Oxygen Therapy: Patient Spontanous Breathing and Patient connected to face mask oxygen  Post-op Assessment: Report given to RN and Post -op Vital signs reviewed and stable  Post vital signs: Reviewed and stable  Last Vitals:  Vitals Value Taken Time  BP 140/80 04/19/20 0845  Temp    Pulse 47 04/19/20 0847  Resp 10 04/19/20 0847  SpO2 97 % 04/19/20 0847  Vitals shown include unvalidated device data.  Last Pain:  Vitals:   04/19/20 0615  PainSc: 0-No pain         Complications: No complications documented.

## 2020-04-19 NOTE — Anesthesia Postprocedure Evaluation (Signed)
Anesthesia Post Note  Patient: Douglas Medina  Procedure(s) Performed: SPERMATOCELECTOMY (Left )  Patient location during evaluation: PACU Anesthesia Type: General Level of consciousness: awake and alert and oriented Pain management: pain level controlled Vital Signs Assessment: post-procedure vital signs reviewed and stable Respiratory status: spontaneous breathing Cardiovascular status: blood pressure returned to baseline Anesthetic complications: no   No complications documented.   Last Vitals:  Vitals:   04/19/20 0901 04/19/20 0915  BP: 118/64 (!) 144/86  Pulse: (!) 48 90  Resp: (!) 21   Temp:    SpO2: 99%     Last Pain:  Vitals:   04/19/20 0917  PainSc: 8                  Nakiyah Beverley

## 2020-04-19 NOTE — Anesthesia Preprocedure Evaluation (Signed)
Anesthesia Evaluation  Patient identified by MRN, date of birth, ID band Patient awake    Reviewed: Allergy & Precautions, NPO status , Patient's Chart, lab work & pertinent test results  Airway Mallampati: II  TM Distance: >3 FB     Dental  (+) Teeth Intact   Pulmonary neg pulmonary ROS,    Pulmonary exam normal        Cardiovascular hypertension, Normal cardiovascular exam+ Valvular Problems/Murmurs      Neuro/Psych Anxiety    GI/Hepatic Neg liver ROS, GERD  Medicated and Controlled,  Endo/Other  negative endocrine ROS  Renal/GU negative Renal ROS  negative genitourinary   Musculoskeletal negative musculoskeletal ROS (+)   Abdominal Normal abdominal exam  (+)   Peds negative pediatric ROS (+)  Hematology negative hematology ROS (+)   Anesthesia Other Findings   Reproductive/Obstetrics                             Anesthesia Physical Anesthesia Plan  ASA: II  Anesthesia Plan: General   Post-op Pain Management:    Induction: Intravenous  PONV Risk Score and Plan:   Airway Management Planned: LMA and Oral ETT  Additional Equipment:   Intra-op Plan:   Post-operative Plan: Extubation in OR  Informed Consent: I have reviewed the patients History and Physical, chart, labs and discussed the procedure including the risks, benefits and alternatives for the proposed anesthesia with the patient or authorized representative who has indicated his/her understanding and acceptance.     Dental advisory given  Plan Discussed with: CRNA and Surgeon  Anesthesia Plan Comments:         Anesthesia Quick Evaluation

## 2020-04-19 NOTE — Anesthesia Procedure Notes (Signed)
Procedure Name: LMA Insertion Date/Time: 04/19/2020 7:36 AM Performed by: Elmarie Mainland, CRNA Pre-anesthesia Checklist: Patient identified, Emergency Drugs available, Suction available and Patient being monitored Patient Re-evaluated:Patient Re-evaluated prior to induction Oxygen Delivery Method: Circle system utilized Induction Type: IV induction Ventilation: Mask ventilation without difficulty LMA: LMA inserted LMA Size: 4.5 Number of attempts: 1 Placement Confirmation: positive ETCO2 and breath sounds checked- equal and bilateral Tube secured with: Tape Dental Injury: Teeth and Oropharynx as per pre-operative assessment

## 2020-04-19 NOTE — Discharge Instructions (Signed)
Discharge instructions following scrotal surgery  Call your doctor for:  Fever is greater than 100.5  Severe nausea or vomiting  Increasing pain not controlled by pain medication  Increasing redness or drainage from incisions  The number for questions or concerns is [phone]  Activity level: No lifting greater than 10 pounds (about equal to gallon of milk) for the next 2 weeks or until cleared to do so at follow-up appointment.  Otherwise activity as tolerated by comfort level.  Diet: May resume your regular diet as tolerated  Medications:  You may resume your regular medications  A prescription for pain medication was sent to your pharmacy  Driving: No driving while still taking opiate pain medications (weight at least 6-8 hours after last dose).  No driving if you still sore from surgery as it may limit her ability to react quickly if necessary.   Shower/bath: May shower 48 hours after surgery.  No tub bath, hot tub or swimming for 10 days.  Wound care: He may cover wounds with sterile gauze as needed to prevent incisions rubbing on close follow-up in any seepage.  Where supportive underwear (compression shorts, jockey shorts) for at least 2 weeks.  He should apply cold compresses (ice or sac of frozen peas/corn) to your scrotum for at least 48 hours to reduce the swelling.  You should expect that his scrotum will swell up initially and then get smaller over the next 2-4 weeks.  Bruising and swelling is normal  Follow-up appointments: Follow-up appointment scheduled with Dr. Lonna Cobb 05/18/2020  AMBULATORY SURGERY  DISCHARGE INSTRUCTIONS   1) The drugs that you were given will stay in your system until tomorrow so for the next 24 hours you should not:  A) Drive an automobile B) Make any legal decisions C) Drink any alcoholic beverage   2) You may resume regular meals tomorrow.  Today it is better to start with liquids and gradually work up to solid foods.  You  may eat anything you prefer, but it is better to start with liquids, then soup and crackers, and gradually work up to solid foods.   3) Please notify your doctor immediately if you have any unusual bleeding, trouble breathing, redness and pain at the surgery site, drainage, fever, or pain not relieved by medication.    4) Additional Instructions:   Please contact your physician with any problems or Same Day Surgery at [phone], Monday through Friday 6 am to 4 pm, or  at [facility] number at [phone].

## 2020-04-20 ENCOUNTER — Telehealth: Payer: Self-pay

## 2020-04-20 LAB — SURGICAL PATHOLOGY

## 2020-04-20 NOTE — Telephone Encounter (Signed)
Patient called one day post spermatocelectomy, stating that he removed his bandage and noticed swelling in the area that was removed. He wants to know if this is normal and if this will go away or if he needs to be seen. Patient was reassured that swelling is normal this close to surgery and will get better with in a week or so. He denies fever, chills, nausea and no puss or redness at incision site. Patient was told to call if these symptoms present otherwise follow up as scheduled. Patient verbalized understanding

## 2020-04-22 ENCOUNTER — Encounter: Payer: Self-pay | Admitting: Physician Assistant

## 2020-04-22 ENCOUNTER — Ambulatory Visit (INDEPENDENT_AMBULATORY_CARE_PROVIDER_SITE_OTHER): Payer: BC Managed Care – PPO | Admitting: Physician Assistant

## 2020-04-22 ENCOUNTER — Other Ambulatory Visit: Payer: Self-pay

## 2020-04-22 VITALS — BP 182/84 | HR 72 | Temp 98.1°F | Ht 70.0 in | Wt 180.0 lb

## 2020-04-22 DIAGNOSIS — Z5189 Encounter for other specified aftercare: Secondary | ICD-10-CM

## 2020-04-22 NOTE — Patient Instructions (Addendum)
Swelling in the scrotum may take some time to resolve, as the scrotum is located below your torso and fluid has to work against gravity to leave this tissue. To promote resolution of swelling and decrease discomfort, please follow the scrotal support instructions below.  Scrotal support instructions:  1. Wear compressive underwear, e.g. jockstrap or snug boxer briefs, to keep the scrotum supported and promote drainage of swelling. 2. Elevate the scrotum when at rest. Tuck a rolled washcloth or towel underneath the scrotum to lift it up and promote drainage back into your abdomen. 3. Use ice as needed for pain relief. Never apply ice to the scrotum for longer than 20 minutes at a time and always keep a layer of fabric between the ice and the skin.  Signs that you should return to clinic due to poor wound healing:  Swelling, redness, pain, or warmth that is new or suddenly worse  An opening in the surgical incision  Pus draining from the surgical incision

## 2020-04-22 NOTE — Progress Notes (Signed)
04/22/2020 12:12 PM   Douglas Medina 07-13-1975 [id number]  CC: Chief Complaint  Patient presents with  . Wound Dehiscence    HPI: Douglas Medina is a 45 y.o. male s/p left spermatocelectomy with Dr. Lonna Cobb 3 days ago presents today for evaluation of possible wound dehiscence.  Today he reports having showered for the first time postoperatively this morning.  He states while in the shower, he felt a "pop" originating from his incision.  He is concerned that the wound may have reopened.  He is kept the wound covered and noticed some scant bleeding on the overlying gauze.  He is unsure if he is overreacting.    PMH: Past Medical History:  Diagnosis Date  . Anxiety   . GERD (gastroesophageal reflux disease)   . Heart murmur    as a child  . Hypertension     Surgical History: Past Surgical History:  Procedure Laterality Date  . SPERMATOCELECTOMY Left 04/19/2020   Procedure: SPERMATOCELECTOMY;  Surgeon: Riki Altes, MD;  Location: [facility] ORS;  Service: Urology;  Laterality: Left;  . TONSILLECTOMY      Home Medications:  Allergies as of 04/22/2020      Reactions   Codeine Nausea And Vomiting      Medication List       Accurate as of April 22, 2020 12:12 PM. If you have any questions, ask your nurse or doctor.        aspirin EC 81 MG tablet Take 81 mg by mouth daily. Swallow whole.   Biotin 2500 MCG Caps Take 25 mcg by mouth at bedtime.   CAL-MAG-ZINC PO Take 1 tablet by mouth daily.   HYDROcodone-acetaminophen 5-325 MG tablet Commonly known as: NORCO/VICODIN Take 1-2 tablets by mouth every 6 (six) hours as needed for moderate pain.   lisinopril 20 MG tablet Commonly known as: ZESTRIL Take 2 tablets (40 mg total) by mouth 2 (two) times daily.   pantoprazole 20 MG tablet Commonly known as: PROTONIX Take 20 mg by mouth daily.   VITAMIN B-12 PO Take 1 tablet by mouth daily.   vitamin C 1000 MG tablet Take 1,000 mg by mouth at bedtime.   VITAMIN D3  PO Take 2,500 mg by mouth daily.       Allergies:  Allergies  Allergen Reactions  . Codeine Nausea And Vomiting    Family History: No family history on file.  Social History:   reports that he has never smoked. He has never used smokeless tobacco. He reports previous alcohol use. He reports previous drug use.  Physical Exam: BP (!) 182/84   Pulse 72   Temp 98.1 F (36.7 C) (Oral)   Ht 5\' 10"  (1.778 m)   Wt 180 lb (81.6 kg)   SpO2 99%   BMI 25.83 kg/m   Constitutional:  Alert and oriented, no acute distress, nontoxic appearing HEENT: [place], AT Cardiovascular: No clubbing, cyanosis, or edema Respiratory: Normal respiratory effort, no increased work of breathing GU: Intact spermatocelectomy wound with minimal erythema along the incision. Scant dried blood along the incision without purulence. Mild posterior scrotal bruising with no significant scrotal edema or tenderness. Skin: No rashes, bruises or suspicious lesions Neurologic: Grossly intact, no focal deficits, moving all 4 extremities Psychiatric: Anxious mood and affect  Assessment & Plan:   1. Visit for wound check Wound is clean, dry, and intact on physical exam today without dehiscence or purulence. Provided reassurance to the patient that there are no worrisome signs of  poor wound healing today. Reviewed scrotal support instructions and return precautions including new erythema, edema, warmth, pain, or purulent drainage. He expressed understanding.  Hypertension likely 2/2 anxiety regarding wound, counseled patient to follow up with PCP.   Return if symptoms worsen or fail to improve.  Carman Ching, PA-C  [facility] [address], [address] [place], [place] [address] [phone]

## 2020-04-24 NOTE — Op Note (Signed)
Preoperative diagnosis:  1. Left spermatocele  Postoperative diagnosis:  1. Same  Procedure: 1. Left spermatocelectomy  Surgeon: Riki Altes, MD  Anesthesia: General  Complications: None  Intraoperative findings:   Moderate left spermatocele  EBL: Minimal  Specimens: Spermatocele  Indication: Douglas Medina is a 45 y.o. male with an ~ 5 cm left spermatocele who has requested excision.  After reviewing the management options for treatment, he elected to proceed with the above surgical procedure(s). We have discussed the potential benefits and risks of the procedure, side effects of the proposed treatment, the likelihood of the patient achieving the goals of the procedure, and any potential problems that might occur during the procedure or recuperation. Informed consent has been obtained.  Description of procedure:  The patient was taken to the operating room and general anesthesia was induced.  The patient was placed in the dorsal lithotomy position, prepped and draped in the usual sterile fashion, and preoperative antibiotics were administered. A preoperative time-out was performed.   A transverse incision was made in the midportion of the left hemiscrotum and carried through the dartos fascia with cautery.  The tunica vaginalis was notified, elevated with forceps and incised with tenotomy scissors.  The tunica vaginalis was further opened with cautery and the testis was delivered into the operative field.  The testis was normal in appearance.  Thin fascia overlying the spermatocele was incised with cautery.  The spermatocele was dissected free from the epididymis with a combination of sharp dissection and cautery.  The spermatocele was located superior to the testis and inferior to the head of the epididymis and once excised the caput epididymis was loosely approximated with 4-0 Monocryl to remaining fascia superior to the tunica albuginea of the superior pole of the testis.   Hemostasis was obtained with cautery and noted be adequate  The testis was delivered back into the left hemiscrotum in its anatomic position.  The dartos was closed with a running 3-0 Monocryl suture and the skin was closed with a running horizontal mattress suture of 3-0 Monocryl.  Bacitracin ointment and a scrotal turban dressing was applied along with a scrotal support.  After anesthetic reversal patient was transported to the PACU in stable condition.   Riki Altes, M.D.

## 2020-04-28 ENCOUNTER — Telehealth: Payer: Self-pay

## 2020-04-28 NOTE — Telephone Encounter (Signed)
Pt called triage line, he states that since going back to work this week he has noticed some mild swelling of the testicle. Patient denies drainage from incision, redness, or pain. Pt denies heavy lifting or strenuous activity. Verbally gave pt previous precautions given to him on 04/22/20 by PA. Pt voiced understanding. Advised pt call back if symptoms worsen.

## 2020-05-18 ENCOUNTER — Other Ambulatory Visit: Payer: Self-pay

## 2020-05-18 ENCOUNTER — Ambulatory Visit (INDEPENDENT_AMBULATORY_CARE_PROVIDER_SITE_OTHER): Payer: BC Managed Care – PPO | Admitting: Urology

## 2020-05-18 ENCOUNTER — Encounter: Payer: Self-pay | Admitting: Urology

## 2020-05-18 VITALS — BP 167/98 | HR 80 | Ht 70.0 in | Wt 175.0 lb

## 2020-05-18 DIAGNOSIS — Z09 Encounter for follow-up examination after completed treatment for conditions other than malignant neoplasm: Secondary | ICD-10-CM

## 2020-05-19 ENCOUNTER — Encounter: Payer: Self-pay | Admitting: Urology

## 2020-05-19 NOTE — Progress Notes (Signed)
   05/18/2020 12:54 PM   Douglas Medina [birth year] [id number]  Referring provider: Jaclyn Shaggy, MD [address]   [place],  [place] [address]  Chief Complaint  Patient presents with  . Other    HPI: 45 y.o. male presents for postop follow-up.   Status post left spermatocelectomy 04/19/2020  Was seen 3 days postop for concern of wound separation with unremarkable exam  Postop pain has significantly improved and he is eating back into regular activities  Path report consistent with benign spermatocele   PMH: Past Medical History:  Diagnosis Date  . Anxiety   . GERD (gastroesophageal reflux disease)   . Heart murmur    as a child  . Hypertension     Surgical History: Past Surgical History:  Procedure Laterality Date  . SPERMATOCELECTOMY Left 04/19/2020   Procedure: SPERMATOCELECTOMY;  Surgeon: Riki Altes, MD;  Location: [facility] ORS;  Service: Urology;  Laterality: Left;  . TONSILLECTOMY      Home Medications:  Allergies as of 05/18/2020      Reactions   Codeine Nausea And Vomiting      Medication List       Accurate as of May 18, 2020 11:59 PM. If you have any questions, ask your nurse or doctor.        STOP taking these medications   HYDROcodone-acetaminophen 5-325 MG tablet Commonly known as: NORCO/VICODIN Stopped by: Riki Altes, MD     TAKE these medications   aspirin EC 81 MG tablet Take 81 mg by mouth daily. Swallow whole.   Biotin 2500 MCG Caps Take 25 mcg by mouth at bedtime.   CAL-MAG-ZINC PO Take 1 tablet by mouth daily.   lisinopril 20 MG tablet Commonly known as: ZESTRIL Take 2 tablets (40 mg total) by mouth 2 (two) times daily.   pantoprazole 20 MG tablet Commonly known as: PROTONIX Take 20 mg by mouth daily.   VITAMIN B-12 PO Take 1 tablet by mouth daily.   vitamin C 1000 MG tablet Take 1,000 mg by mouth at bedtime.   VITAMIN D3 PO Take 2,500 mg by mouth daily.       Allergies:  Allergies  Allergen  Reactions  . Codeine Nausea And Vomiting    Family History: No family history on file.  Social History:  reports that he has never smoked. He has never used smokeless tobacco. He reports previous alcohol use. He reports previous drug use.   Physical Exam: BP (!) 167/98   Pulse 80   Ht 5\' 10"  (1.778 m)   Wt 175 lb (79.4 kg)   BMI 25.11 kg/m   Constitutional:  Alert and oriented, No acute distress. HEENT: [place] AT, moist mucus membranes.  Trachea midline, no masses. Cardiovascular: No clubbing, cyanosis, or edema. Respiratory: Normal respiratory effort, no increased work of breathing. GI: Abdomen is soft, nontender, nondistended, no abdominal masses GU: Scrotal incision healing mild tenderness superior left testis at surgical site   Assessment & Plan:    1.  Status post left spermatocelectomy  Doing well  Follow-up prn   , MD  [facility] [address] [place], [place] [phone]

## 2020-11-09 ENCOUNTER — Encounter: Payer: Self-pay | Admitting: Urology

## 2020-11-09 ENCOUNTER — Other Ambulatory Visit: Payer: Self-pay

## 2020-11-09 ENCOUNTER — Ambulatory Visit (INDEPENDENT_AMBULATORY_CARE_PROVIDER_SITE_OTHER): Payer: BC Managed Care – PPO | Admitting: Urology

## 2020-11-09 VITALS — BP 155/89 | HR 72 | Ht 70.0 in | Wt 175.0 lb

## 2020-11-09 DIAGNOSIS — N400 Enlarged prostate without lower urinary tract symptoms: Secondary | ICD-10-CM

## 2020-11-09 DIAGNOSIS — N529 Male erectile dysfunction, unspecified: Secondary | ICD-10-CM | POA: Diagnosis not present

## 2020-11-09 NOTE — Progress Notes (Signed)
   11/09/2020 11:01 AM   Douglas Medina [birth year] [id number]  Referring provider: Jaclyn Shaggy, MD [address]   [place],  [place] [address]  Chief Complaint  Patient presents with   Other    HPI: 45 y.o. male requested visit for a prostate check.  Recent visit with PCP and was told prostate was larger on DRE than last year's exam No bothersome LUTS Previously followed for spermatocele and underwent surgical excision 03/2020 Was complaining of ED 03/2020 that correlated with increasing dosage of lisinopril and was inquiring if this could be related to his prostate Still having some decreased erectile rigidity   PMH: Past Medical History:  Diagnosis Date   Anxiety    GERD (gastroesophageal reflux disease)    Heart murmur    as a child   Hypertension     Surgical History: Past Surgical History:  Procedure Laterality Date   SPERMATOCELECTOMY Left 04/19/2020   Procedure: SPERMATOCELECTOMY;  Surgeon: Riki Altes, MD;  Location: [facility] ORS;  Service: Urology;  Laterality: Left;   TONSILLECTOMY      Home Medications:  Allergies as of 11/09/2020       Reactions   Codeine Nausea And Vomiting        Medication List        Accurate as of November 09, 2020 11:01 AM. If you have any questions, ask your nurse or doctor.          aspirin EC 81 MG tablet Take 81 mg by mouth daily. Swallow whole.   Biotin 2500 MCG Caps Take 25 mcg by mouth at bedtime.   CAL-MAG-ZINC PO Take 1 tablet by mouth daily.   lisinopril 20 MG tablet Commonly known as: ZESTRIL Take 2 tablets (40 mg total) by mouth 2 (two) times daily.   pantoprazole 20 MG tablet Commonly known as: PROTONIX Take 20 mg by mouth daily.   VITAMIN B-12 PO Take 1 tablet by mouth daily.   vitamin C 1000 MG tablet Take 1,000 mg by mouth at bedtime.   VITAMIN D3 PO Take 2,500 mg by mouth daily.        Allergies:  Allergies  Allergen Reactions   Codeine Nausea And Vomiting    Family  History: History reviewed. No pertinent family history.  Social History:  reports that he has never smoked. He has never used smokeless tobacco. He reports that he does not currently use alcohol. He reports that he does not currently use drugs.   Physical Exam: BP (!) 155/89   Pulse 72   Ht 5\' 10"  (1.778 m)   Wt 175 lb (79.4 kg)   BMI 25.11 kg/m   Constitutional:  Alert and oriented, No acute distress. HEENT: [place] AT, moist mucus membranes.  Trachea midline, no masses. Cardiovascular: No clubbing, cyanosis, or edema. Respiratory: Normal respiratory effort, no increased work of breathing. GU: Prostate 40 g, smooth without nodules Neurologic: Grossly intact, no focal deficits, moving all 4 extremities. Psychiatric: Normal mood and affect.   Assessment & Plan:    1.  BPH without LUTS Benign DRE We discussed current AUA recommendations for prostate cancer screening are annual PSA/DRE from ages 15-69  2.  Erectile dysfunction He was informed that this would not be related to BPH and more related to hypertension/antihypertensive medications Check testosterone/LH   51-72, MD  [facility] [address] [place], [place] [phone]

## 2020-11-12 LAB — TESTOSTERONE: Testosterone: 544 ng/dL (ref 264–916)

## 2020-11-12 LAB — LUTEINIZING HORMONE: LH: 6.1 m[IU]/mL (ref 1.7–8.6)

## 2020-11-14 ENCOUNTER — Telehealth: Payer: Self-pay | Admitting: *Deleted

## 2020-11-14 NOTE — Telephone Encounter (Signed)
-----   Message from [name], PA-C sent at 11/14/2020 10:43 AM EDT ----- Please let Mr. Douglas Medina know that his testosterone level was normal.

## 2020-11-14 NOTE — Telephone Encounter (Signed)
Notified patient as instructed, patient pleased °

## 2021-09-15 ENCOUNTER — Ambulatory Visit (INDEPENDENT_AMBULATORY_CARE_PROVIDER_SITE_OTHER): Payer: BC Managed Care – PPO | Admitting: Physician Assistant

## 2021-09-15 ENCOUNTER — Encounter: Payer: Self-pay | Admitting: Physician Assistant

## 2021-09-15 VITALS — BP 135/82 | HR 71 | Ht 70.0 in | Wt 162.0 lb

## 2021-09-15 DIAGNOSIS — N5082 Scrotal pain: Secondary | ICD-10-CM

## 2021-09-15 LAB — MICROSCOPIC EXAMINATION: Bacteria, UA: NONE SEEN

## 2021-09-15 LAB — URINALYSIS, COMPLETE
Bilirubin, UA: NEGATIVE
Glucose, UA: NEGATIVE
Ketones, UA: NEGATIVE
Leukocytes,UA: NEGATIVE
Nitrite, UA: NEGATIVE
Protein,UA: NEGATIVE
RBC, UA: NEGATIVE
Specific Gravity, UA: 1.02 (ref 1.005–1.030)
Urobilinogen, Ur: 0.2 mg/dL (ref 0.2–1.0)
pH, UA: 6 (ref 5.0–7.5)

## 2021-09-15 MED ORDER — CELECOXIB 100 MG PO CAPS: 100.0000 mg | ORAL_CAPSULE | Freq: Two times a day (BID) | ORAL | 0 refills | Status: AC

## 2021-09-15 NOTE — Progress Notes (Signed)
09/15/2021 12:34 PM   Douglas Medina [birth year] [id number]  CC: Chief Complaint  Patient presents with   Follow-up    Scrotal pain   HPI: Douglas Medina is a 46 y.o. male with PMH hypertension on lisinopril and amlodipine, ED on Cialis,, and spermatocele who underwent left spermatocelectomy with Dr. Lonna Cobb on 04/19/2020 who presents today for valuation of left scrotal pain.   Today he reports intermittent, mild left scrotal pain that has been ongoing since his spermatocelectomy last year.  He notes that it is worse with heavy lifting.  The pain is localized to the incision site and inferior portion of the left testicle.  He has not noticed any swelling, dysuria, penile discharge, groin bulging, or skin lesions.  He also noticed that his left testicle rises less than the right when he performs Kegel exercises.  He is also noted a prominent vein over the left scrotum.  In-office UA and microscopy today pan negative.  PMH: Past Medical History:  Diagnosis Date   Anxiety    GERD (gastroesophageal reflux disease)    Heart murmur    as a child   Hypertension     Surgical History: Past Surgical History:  Procedure Laterality Date   SPERMATOCELECTOMY Left 04/19/2020   Procedure: SPERMATOCELECTOMY;  Surgeon: Riki Altes, MD;  Location: [facility] ORS;  Service: Urology;  Laterality: Left;   TONSILLECTOMY      Home Medications:  Allergies as of 09/15/2021       Reactions   Codeine Nausea And Vomiting        Medication List        Accurate as of September 15, 2021 12:34 PM. If you have any questions, ask your nurse or doctor.          aspirin EC 81 MG tablet Take 81 mg by mouth daily. Swallow whole.   Biotin 2500 MCG Caps Take 25 mcg by mouth at bedtime.   CAL-MAG-ZINC PO Take 1 tablet by mouth daily.   celecoxib 100 MG capsule Commonly known as: CeleBREX Take 1 capsule (100 mg total) by mouth 2 (two) times daily for 14 days. Started by: Carman Ching, PA-C    lisinopril 20 MG tablet Commonly known as: ZESTRIL Take 2 tablets (40 mg total) by mouth 2 (two) times daily.   pantoprazole 20 MG tablet Commonly known as: PROTONIX Take 20 mg by mouth daily.   VITAMIN B-12 PO Take 1 tablet by mouth daily.   vitamin C 1000 MG tablet Take 1,000 mg by mouth at bedtime.   VITAMIN D3 PO Take 2,500 mg by mouth daily.        Allergies:  Allergies  Allergen Reactions   Codeine Nausea And Vomiting    Family History: No family history on file.  Social History:   reports that he has never smoked. He has never been exposed to tobacco smoke. He has never used smokeless tobacco. He reports that he does not currently use alcohol. He reports that he does not currently use drugs.  Physical Exam: BP 135/82   Pulse 71   Ht 5\' 10"  (1.778 m)   Wt 162 lb (73.5 kg)   BMI 23.24 kg/m   Constitutional:  Alert and oriented, no acute distress, nontoxic appearing HEENT: [place], AT Cardiovascular: No clubbing, cyanosis, or edema Respiratory: Normal respiratory effort, no increased work of breathing GU: Bilateral descended testicles.  Nonenlarged and nontender bilateral epididymides.  Nontender testes and spermatic cords.  Well-healed prior spermatocelectomy scar noted  over the anterior scrotum. Skin: No rashes, bruises or suspicious lesions Neurologic: Grossly intact, no focal deficits, moving all 4 extremities Psychiatric: Normal mood and affect  Laboratory Data: Results for orders placed or performed in visit on 09/15/21  Microscopic Examination   Urine  Result Value Ref Range   WBC, UA 0-5 0 - 5 /hpf   RBC, Urine 0-2 0 - 2 /hpf   Epithelial Cells (non renal) 0-10 0 - 10 /hpf   Bacteria, UA None seen None seen/Few  Urinalysis, Complete  Result Value Ref Range   Specific Gravity, UA 1.020 1.005 - 1.030   pH, UA 6.0 5.0 - 7.5   Color, UA Yellow Yellow   Appearance Ur Clear Clear   Leukocytes,UA Negative Negative   Protein,UA Negative Negative/Trace    Glucose, UA Negative Negative   Ketones, UA Negative Negative   RBC, UA Negative Negative   Bilirubin, UA Negative Negative   Urobilinogen, Ur 0.2 0.2 - 1.0 mg/dL   Nitrite, UA Negative Negative   Microscopic Examination See below:    Assessment & Plan:   1. Scrotal pain Normal exam and UA today, no evidence of epididymoorchitis or hernia.  Pain is chronic, no indication for acute imaging at this time.  Will obtain scrotal ultrasound with Doppler to rule out recurrent spermatocele.  We will treat with 2 weeks of Celebrex.  Patient to follow-up as needed with recurrent or persistent pain. - Urinalysis, Complete - US SCROTUM W/DOPPLER; Future - celecoxib (CELEBREX) 100 MG capsule; Take 1 capsule (100 mg total) by mouth 2 (two) times daily for 14 days.  Dispense: 28 capsule; Refill: 0  Return if symptoms worsen or fail to improve, for Will call with results.  Carman Ching, PA-C  [facility] [address] [place], [place] [address] [phone]

## 2021-09-18 ENCOUNTER — Telehealth: Payer: Self-pay | Admitting: *Deleted

## 2021-09-18 NOTE — Telephone Encounter (Signed)
Spoke with patient and he will check with [facility] to see the cost and call us tomorrow if it's too expensive

## 2021-09-18 NOTE — Telephone Encounter (Signed)
Calling pt to see if he picked up his rx for celebrex? We received fax asking for prior auth, thru goodrx this is less than $20.00 or we can switch to Mobic 7.5mg  daily
